# Patient Record
Sex: Female | Born: 1995 | Race: White | Hispanic: No | Marital: Single | Smoking: Former smoker
Health system: Southern US, Community
[De-identification: ages and names within clinical notes are randomized; demographics above are authoritative.]

## PROBLEM LIST (undated history)

## (undated) DIAGNOSIS — F419 Anxiety disorder, unspecified: Secondary | ICD-10-CM

## (undated) DIAGNOSIS — T7840XA Allergy, unspecified, initial encounter: Secondary | ICD-10-CM

## (undated) DIAGNOSIS — J45909 Unspecified asthma, uncomplicated: Secondary | ICD-10-CM

## (undated) HISTORY — PX: TONSILLECTOMY: SUR1361

## (undated) HISTORY — PX: KNEE RECONSTRUCTION: SHX5883

## (undated) HISTORY — DX: Allergy, unspecified, initial encounter: T78.40XA

---
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
## 2014-10-01 ENCOUNTER — Emergency Department (INDEPENDENT_AMBULATORY_CARE_PROVIDER_SITE_OTHER): Payer: Self-pay

## 2014-10-01 ENCOUNTER — Emergency Department
Admission: EM | Admit: 2014-10-01 | Discharge: 2014-10-01 | Disposition: A | Discharge status: Home / Self Care | Payer: 59 | Source: Home / Self Care | Attending: Emergency Medicine | Admitting: Emergency Medicine

## 2014-10-01 ENCOUNTER — Encounter: Payer: Self-pay | Admitting: Emergency Medicine

## 2014-10-01 DIAGNOSIS — S29019A Strain of muscle and tendon of unspecified wall of thorax, initial encounter: Secondary | ICD-10-CM | POA: Diagnosis not present

## 2014-10-01 DIAGNOSIS — S2341XA Sprain of ribs, initial encounter: Secondary | ICD-10-CM | POA: Diagnosis not present

## 2014-10-01 DIAGNOSIS — R0789 Other chest pain: Secondary | ICD-10-CM

## 2014-10-01 DIAGNOSIS — R05 Cough: Secondary | ICD-10-CM

## 2014-10-01 DIAGNOSIS — J4521 Mild intermittent asthma with (acute) exacerbation: Secondary | ICD-10-CM | POA: Diagnosis not present

## 2014-10-01 DIAGNOSIS — R109 Unspecified abdominal pain: Secondary | ICD-10-CM | POA: Diagnosis not present

## 2014-10-01 DIAGNOSIS — R0989 Other specified symptoms and signs involving the circulatory and respiratory systems: Secondary | ICD-10-CM

## 2014-10-01 LAB — POCT MONO SCREEN (KUC): Mono, POC: NEGATIVE

## 2014-10-01 LAB — POCT CBC W AUTO DIFF (K'VILLE URGENT CARE)

## 2014-10-01 MED ORDER — HYDROCODONE-ACETAMINOPHEN 5-325 MG PO TABS: ORAL_TABLET | ORAL | Status: DC

## 2014-10-01 MED ORDER — MELOXICAM 7.5 MG PO TABS: 7.5000 mg | ORAL_TABLET | Freq: Two times a day (BID) | ORAL | Status: DC

## 2014-10-01 MED ORDER — PREDNISONE (PAK) 10 MG PO TABS: ORAL_TABLET | ORAL | Status: DC

## 2014-10-01 NOTE — ED Notes (Signed)
Rt flank pain, patient states she had flu-like sxs last week with cough and since then has had severe rt flank pain, sore to touch x 1 week

## 2014-10-01 NOTE — Discharge Instructions (Signed)
Today, chest x-ray and x-ray right ribs show no fracture. Your complete blood count is normal. Mono test negative.   Asthma Asthma is a condition of the lungs in which the airways tighten and narrow. Asthma can make it hard to breathe. Asthma cannot be cured, but medicine and lifestyle changes can help control it. Asthma may be started (triggered) by:  Animal skin flakes (dander).  Dust.  Cockroaches.  Pollen.  Mold.  Smoke.  Cleaning products.  Hair sprays or aerosol sprays.  Paint fumes or strong smells.  Cold air, weather changes, and winds.  Crying or laughing hard.  Stress.  Certain medicines or drugs.  Foods, such as dried fruit, potato chips, and sparkling grape juice.  Infections or conditions (colds, flu).  Exercise.  Certain medical conditions or diseases.  Exercise or tiring activities. HOME CARE   Take medicine as told by your doctor.  Use a peak flow meter as told by your doctor. A peak flow meter is a tool that measures how well the lungs are working.  Record and keep track of the peak flow meter's readings.  Understand and use the asthma action plan. An asthma action plan is a written plan for taking care of your asthma and treating your attacks.  To help prevent asthma attacks:  Do not smoke. Stay away from secondhand smoke.  Change your heating and air conditioning filter often.  Limit your use of fireplaces and wood stoves.  Get rid of pests (such as roaches and mice) and their droppings.  Throw away plants if you see mold on them.  Clean your floors. Dust regularly. Use cleaning products that do not smell.  Have someone vacuum when you are not home. Use a vacuum cleaner with a HEPA filter if possible.  Replace carpet with wood, tile, or vinyl flooring. Carpet can trap animal skin flakes and dust.  Use allergy-proof pillows, mattress covers, and box spring covers.  Wash bed sheets and blankets every week in hot water and dry  them in a dryer.  Use blankets that are made of polyester or cotton.  Clean bathrooms and kitchens with bleach. If possible, have someone repaint the walls in these rooms with mold-resistant paint. Keep out of the rooms that are being cleaned and painted.  Wash hands often. GET HELP IF:  You have make a whistling sound when breaking (wheeze), have shortness of breath, or have a cough even if taking medicine to prevent attacks.  The colored mucus you cough up (sputum) is thicker than usual.  The colored mucus you cough up changes from clear or white to yellow, green, gray, or bloody.  You have problems from the medicine you are taking such as:  A rash.  Itching.  Swelling.  Trouble breathing.  You need reliever medicines more than 2-3 times a week.  Your peak flow measurement is still at 50-79% of your personal best after following the action plan for 1 hour.  You have a fever. GET HELP RIGHT AWAY IF:   You seem to be worse and are not responding to medicine during an asthma attack.  You are short of breath even at rest.  You get short of breath when doing very little activity.  You have trouble eating, drinking, or talking.  You have chest pain.  You have a fast heartbeat.  Your lips or fingernails start to turn blue.  You are light-headed, dizzy, or faint.  Your peak flow is less than 50% of your personal best.  MAKE SURE YOU:   Understand these instructions.  Will watch your condition.  Will get help right away if you are not doing well or get worse.  A rib contusion (bruise) can occur by a blow to the chest or by a fall against a hard object. Usually these will be much better in a couple weeks. If X-rays were taken today and there are no broken bones (fractures), the diagnosis of bruising is made. However, broken ribs may not show up for several days, or may be discovered later on a routine X-ray when signs of healing show up. If this happens to you, it does  not mean that something was missed on the X-ray, but simply that it did not show up on the first X-rays. Earlier diagnosis will not usually change the treatment. HOME CARE INSTRUCTIONS   Avoid strenuous activity. Be careful during activities and avoid bumping the injured ribs. Activities that pull on the injured ribs and cause pain should be avoided, if possible.  For the first day or two, an ice pack used every 20 minutes while awake may be helpful. Put ice in a plastic bag and put a towel between the bag and the skin.  Eat a normal, well-balanced diet. Drink plenty of fluids to avoid constipation.  Take deep breaths several times a day to keep lungs free of infection. Try to cough several times a day. Splint the injured area with a pillow while coughing to ease pain. Coughing can help prevent pneumonia.  Wear a rib belt or binder only if told to do so by your caregiver. If you are wearing a rib belt or binder, you must do the breathing exercises as directed by your caregiver. If not used properly, rib belts or binders restrict breathing which can lead to pneumonia.  Only take over-the-counter or prescription medicines for pain, discomfort, or fever as directed by your caregiver. SEEK MEDICAL CARE IF:   You have an oral temperature above 102 F (38.9 C)..  You develop a cough, with thick or bloody sputum. SEEK IMMEDIATE MEDICAL CARE IF:   You have difficulty breathing.  You feel sick to your stomach (nausea), have vomiting or belly (abdominal) pain.  You have worsening pain, not controlled with medications, or there is a change in the location of the pain.  You develop sweating or radiation of the pain into the arms, jaw or shoulders, or become light headed or faint.  You have an oral temperature above 102 F (38.9 C), not controlled by medicine.   MAKE SURE YOU:   Understand these instructions.  Will watch your condition.  Will get help right away if you are not doing well  or get worse. Document Released: 04/11/2001 Document Revised: 11/11/2012 Document Reviewed: 03/04/2008 [facility] Patient Information 2015 [place], [place]. This information is not intended to replace advice given to you by your health care provider. Make sure you discuss any questions you have with your health care provider.   Rib Contusion/Strain A rib contusion (bruise) can occur by a blow to the chest or by a fall against a hard object. Usually these will be much better in a couple weeks. If X-rays were taken today and there are no broken bones (fractures), the diagnosis of bruising is made. However, broken ribs may not show up for several days, or may be discovered later on a routine X-ray when signs of healing show up. If this happens to you, it does not mean that something was missed on the  X-ray, but simply that it did not show up on the first X-rays. Earlier diagnosis will not usually change the treatment. HOME CARE INSTRUCTIONS   Avoid strenuous activity. Be careful during activities and avoid bumping the injured ribs. Activities that pull on the injured ribs and cause pain should be avoided, if possible.  For the first day or two, an ice pack used every 20 minutes while awake may be helpful. Put ice in a plastic bag and put a towel between the bag and the skin.  Eat a normal, well-balanced diet. Drink plenty of fluids to avoid constipation.  Take deep breaths several times a day to keep lungs free of infection. Try to cough several times a day. Splint the injured area with a pillow while coughing to ease pain. Coughing can help prevent pneumonia.  Wear a rib belt or binder only if told to do so by your caregiver. If you are wearing a rib belt or binder, you must do the breathing exercises as directed by your caregiver. If not used properly, rib belts or binders restrict breathing which can lead to pneumonia.  Only take over-the-counter or prescription medicines for pain, discomfort, or  fever as directed by your caregiver. SEEK MEDICAL CARE IF:   You or your child has an oral temperature above 102 F (38.9 C).  Your baby is older than 3 months with a rectal temperature of 100.5 F (38.1 C) or higher for more than 1 day.  You develop a cough, with thick or bloody sputum. SEEK IMMEDIATE MEDICAL CARE IF:   You have difficulty breathing.  You feel sick to your stomach (nausea), have vomiting or belly (abdominal) pain.  You have worsening pain, not controlled with medications, or there is a change in the location of the pain.  You develop sweating or radiation of the pain into the arms, jaw or shoulders, or become light headed or faint.  You or your child has an oral temperature above 102 F (38.9 C), not controlled by medicine.  Your or your baby is older than 3 months with a rectal temperature of 102 F (38.9 C) or higher.  Your baby is 69 months old or younger with a rectal temperature of 100.4 F (38 C) or higher. MAKE SURE YOU:   Understand these instructions.  Will watch your condition.  Will get help right away if you are not doing well or get worse. Document Released: 04/11/2001 Document Revised: 11/11/2012 Document Reviewed: 03/04/2008 [facility] Patient Information 2015 [place], [place]. This information is not intended to replace advice given to you by your health care provider. Make sure you discuss any questions you have with your health care provider.

## 2014-10-01 NOTE — ED Provider Notes (Signed)
CSN: [id number]     Arrival date & time 10/01/14  1148 History   First MD Initiated Contact with Patient 10/01/14 1156     Chief Complaint  Patient presents with  . Flank Pain    Patient is a 19 y.o. female presenting with flank pain. The history is provided by the patient.  Flank Pain   Last week had flulike symptoms with fever chills sweats cough and myalgias, was seen at a different urgent care, was told she had a viral syndrome and most of those symptoms have resolved. Fever resolved 3 days ago. Had vomiting last week but that resolved 3 days ago. Still has minimal nausea. Chief complaint today is right lateral chest wall/rib pain, exacerbated by coughing and movement. Pain is sharp, 5 out of 10 at rest, 10 out of 10 with movement and touching right chest. Still has occasional cough. Occasional wheezing. Her albuterol inhaler helps a little bit. Currently denies shortness of breath. Denies any anterior left-sided chest pain or discomfort. Has mild nausea but no vomiting 3 days. Right now, denies nausea today. Tolerating by mouth's. Denies abdominal pain. Denies CVA back pain. Had one or 2 loose stools earlier this week but not currently. No blood or mucus in stool. Last menstrual period started yesterday. She denies chance of pregnancy. No other GYN or GU symptoms. Denies dysuria or urinary frequency. Denies history of kidney stones or kidney disease. She never had mononucleosis. She also admits to recent stressors. She denies frank depression symptoms.  History reviewed. No pertinent past medical history. Past Surgical History  Procedure Laterality Date  . Tonsillectomy    . Knee reconstruction     Family History  Problem Relation Age of Onset  . Heart failure Mother   . Diabetes Father   . Hypertension Father    History  Substance Use Topics  . Smoking status: Current Some Day Smoker    Types: Cigarettes  . Smokeless tobacco: Not on file  . Alcohol Use: No   OB History     No data available     Review of Systems  Constitutional: Positive for fatigue.  Cardiovascular: Negative for palpitations and leg swelling.  Genitourinary: Positive for flank pain (which she means as right chest pain. Denies CVA pain.).  Neurological: Negative for syncope.  Psychiatric/Behavioral: Negative for suicidal ideas and hallucinations.  All other systems reviewed and are negative.   Allergies  Review of patient's allergies indicates no known allergies.  Home Medications   Prior to Admission medications   Not on File   BP 111/75 mmHg  Pulse 90  Temp(Src) 97.6 F (36.4 C) (Oral)  Ht  (1.753 m)  Wt 168 lb (76.204 kg)  BMI 24.80 kg/m2  SpO2 96%  LMP 09/29/2014 Physical Exam  Constitutional: She is oriented to person, place, and time. She appears well-developed and well-nourished. No distress.  Uncomfortable from right sided chest pain, but no acute cardiorespiratory distress. Pulse ox 96% on room air  HENT:  Head: Normocephalic and atraumatic.  Right Ear: External ear normal.  Left Ear: External ear normal.  Nose: Nose normal.  Mouth/Throat: Oropharynx is clear and moist. No oropharyngeal exudate.  Eyes: Conjunctivae are normal. Pupils are equal, round, and reactive to light. Right eye exhibits no discharge. Left eye exhibits no discharge. No scleral icterus.  Neck: Normal range of motion. Neck supple. No JVD present. No tracheal deviation present.  Cardiovascular: Normal rate, regular rhythm, normal heart sounds and intact distal pulses.  Exam  reveals no gallop and no friction rub.   No murmur heard. Pulmonary/Chest: Effort normal. No respiratory distress. She has no decreased breath sounds. She has wheezes (Minimal late expiratory wheezes, but good air movement bilaterally). She has no rhonchi. She has no rales. She exhibits tenderness (Exquisitely tender right anterior lateral and posterior chest/ribs).  Abdominal: Soft. She exhibits no distension and no  mass. There is no hepatosplenomegaly. There is no tenderness. There is no CVA tenderness, no tenderness at McBurney's point and negative Murphy's sign.  Lymphadenopathy:    She has cervical adenopathy (Mild anterior cervical nodes. shoddy, mobile, mildly tender).  Neurological: She is alert and oriented to person, place, and time. No cranial nerve deficit.  Skin: Skin is warm and dry. No rash noted.  Psychiatric: She has a normal mood and affect. Her behavior is normal.    ED Course  Procedures (including critical care time) Labs Review Labs Reviewed  POCT CBC W AUTO DIFF ([facility])  POCT MONO SCREEN ([facility])   CBC within normal limits. WBC 7.9, hemoglobin 14.7 Mono spot negative.  Imaging Review Dg Ribs Unilateral W/chest Right  10/01/2014   CLINICAL DATA:  19 year old female with flu-like symptoms, cough, congestion and fever for 2 weeks. Right-sided chest pain.  EXAM: RIGHT RIBS AND CHEST - 3+ VIEW  COMPARISON:  No priors.  FINDINGS: Lung volumes are normal. No consolidative airspace disease. No pleural effusions. No pneumothorax. No pulmonary nodule or mass noted. Pulmonary vasculature and the cardiomediastinal silhouette are within normal limits.  Dedicated views of the lower right ribs with a radiopaque marker in place demonstrate no acute displaced right-sided rib fractures.  IMPRESSION: 1. No acute displaced right-sided rib fractures. 2. No radiographic evidence of acute cardiopulmonary disease.   Electronically Signed   By: Trudie Reed M.D.   On: 10/01/2014 13:16     MDM   1. Sprain and strain of ribs, initial encounter   2. Asthma with acute exacerbation, mild intermittent    Reviewed that chest x-ray and right rib x-rays negative. No rib fracture seen. No evidence of acute cardiopulmonary disease. Reviewed CBC normal and Monospot negative. Treatment options discussed, as well as risks, benefits, alternatives. I offered a rib belt to help with the pain, but she  declined that. With nurse assistance, with her permission, we wrapped her ribs with a large Ace bandage and that helped relieve some pain. Patient voiced understanding and agreement with the following plans: Apply Heat. Other symptomatic care modalities. RX's: HYDROcodone-acetaminophen (NORCO/VICODIN) 5-325 MG per tablet Take 1 or 2 at bedtime if needed for severe pain or cough. May cause drowsiness. 8 tablet     meloxicam (MOBIC) 7.5 MG tablet Take 1 tablet (7.5 mg total) by mouth 2 (two) times daily with a meal. For pain. 20 tablet    predniSONE (STERAPRED UNI-PAK) 10 MG tablet Take as directed for 6 days.--Take 6 on day one, 5 on day 2, 4 on day 3, then 3 tablets on day 4, then 2 tablets on day 5, then 1 tablet on day 6.    See detailed Instructions in AVS, which were given to patient. Verbal instructions also given. Risks, benefits, and alternatives of treatment options discussed. Questions invited and answered. Patient voiced understanding and agreement with plans. Note written to excuse from work today and tomorrow. Follow-up with your primary care doctor in 5-7 days if not improving, or sooner if symptoms become worse. Precautions discussed. Red flags discussed. Questions invited and answered. Patient voiced understanding  and agreement.    Lajean Manesavid Massey, MD 10/01/14 1355

## 2015-04-07 ENCOUNTER — Encounter: Payer: Self-pay | Admitting: *Deleted

## 2015-04-07 ENCOUNTER — Emergency Department
Admission: EM | Admit: 2015-04-07 | Discharge: 2015-04-07 | Disposition: A | Discharge status: Home / Self Care | Payer: 59 | Source: Home / Self Care | Attending: Family Medicine | Admitting: Family Medicine

## 2015-04-07 DIAGNOSIS — R300 Dysuria: Secondary | ICD-10-CM

## 2015-04-07 DIAGNOSIS — J0100 Acute maxillary sinusitis, unspecified: Secondary | ICD-10-CM | POA: Diagnosis not present

## 2015-04-07 DIAGNOSIS — H109 Unspecified conjunctivitis: Secondary | ICD-10-CM

## 2015-04-07 HISTORY — DX: Unspecified asthma, uncomplicated: J45.909

## 2015-04-07 LAB — POCT URINALYSIS DIP (MANUAL ENTRY)
Bilirubin, UA: NEGATIVE
GLUCOSE UA: NEGATIVE
Ketones, POC UA: NEGATIVE
Leukocytes, UA: NEGATIVE
Nitrite, UA: NEGATIVE
Protein Ur, POC: NEGATIVE
RBC UA: NEGATIVE
Urobilinogen, UA: 0.2 (ref 0–1)
pH, UA: 6 (ref 5–8)

## 2015-04-07 LAB — POCT WET + KOH PREP
Trich by wet prep: ABSENT
YEAST BY KOH: ABSENT
YEAST BY WET PREP: ABSENT

## 2015-04-07 MED ORDER — CEFDINIR 300 MG PO CAPS: 300.0000 mg | ORAL_CAPSULE | Freq: Two times a day (BID) | ORAL | Status: DC

## 2015-04-07 MED ORDER — SULFACETAMIDE SODIUM 10 % OP SOLN: 1.0000 [drp] | OPHTHALMIC | Status: DC

## 2015-04-07 NOTE — ED Notes (Signed)
Pt c/o LT eye redness and yellow d/c x today. She also c/o cough and sore throat x 1 wk. Denies fever. She also c/o dysuria and vaginal irritation x 1 wk.

## 2015-04-07 NOTE — ED Provider Notes (Signed)
CSN: [id number]     Arrival date & time 04/07/15  1615 History   First MD Initiated Contact with Patient 04/07/15 1715     Chief Complaint  Patient presents with  . Eye Problem  . Dysuria      HPI Comments: Patient developed vaginal itching without discharge 1.5 weeks ago.  Her symptoms improved with OTC Monistat.  About a week ago she developed mild dysuria without frequency.  No pelvic or abdominal pain.  Patient's last menstrual period was 03/25/2015.  One week ago she also developed a mild sore throat and nasal congestion without cough.  During the past several days she has felt fatigued and had chills.  Yesterday morning she awoke with left eye redness and irritation.  She complains of left facial pressure and nasal congestion.  The history is provided by the patient.    Past Medical History  Diagnosis Date  . Asthma    Past Surgical History  Procedure Laterality Date  . Tonsillectomy    . Knee reconstruction     Family History  Problem Relation Age of Onset  . Heart failure Mother   . Diabetes Father   . Hypertension Father    Social History  Substance Use Topics  . Smoking status: Former Smoker    Types: Cigarettes  . Smokeless tobacco: None  . Alcohol Use: No   OB History    No data available     Review of Systems + sore throat, resolved No cough No pleuritic pain No wheezing + nasal congestion + post-nasal drainage + sinus pain/pressure + left eye redness and drainage No earache No hemoptysis No SOB No fever, + chills No nausea No vomiting No abdominal pain No diarrhea + urinary symptoms No skin rash + fatigue No myalgias + headache Used OTC meds without relief  Allergies  Review of patient's allergies indicates no known allergies.  Home Medications   Prior to Admission medications   Medication Sig Start Date End Date Taking? Authorizing Provider  albuterol (PROVENTIL HFA;VENTOLIN HFA) 108 (90 BASE) MCG/ACT inhaler Inhale into the lungs  every 6 (six) hours as needed for wheezing or shortness of breath.   Yes Historical Provider, MD  cefdinir (OMNICEF) 300 MG capsule Take 1 capsule (300 mg total) by mouth 2 (two) times daily. 04/07/15   Lattie Haw, MD  sulfacetamide (BLEPH-10) 10 % ophthalmic solution Place 1-2 drops into the left eye every 3 (three) hours while awake. 04/07/15   Lattie Haw, MD   Meds Ordered and Administered this Visit  Medications - No data to display  BP 105/72 mmHg  Pulse 87  Temp(Src) 99.1 F (37.3 C) (Oral)  Resp 16  Ht 5\' 8"  (1.727 m)  Wt 165 lb (74.844 kg)  BMI 25.09 kg/m2  SpO2 99%  LMP 03/25/2015 No data found.   Physical Exam Nursing notes and Vital Signs reviewed. Appearance:  Patient appears stated age, and in no acute distress Eyes:  Pupils are equal, round, and reactive to light and accomodation.  Extraocular movement is intact. Left conjunctivae minimally injected.  No lid swelling or tenderness.  No photophobia. Ears:  Canals normal.  Tympanic membranes normal.  Nose:  Congested turbinates.  Left maxillary sinus tenderness is present.  Pharynx:  Normal Neck:  Supple.  Tender enlarged left posterior nodes palpated Lungs:  Clear to auscultation.  Breath sounds are equal.  Moving air well. Heart:  Regular rate and rhythm without murmurs, rubs, or gallops.  Abdomen:  Nontender  without masses or hepatosplenomegaly.  Bowel sounds are present.  No CVA or flank tenderness.  Extremities:  No edema.  No calf tenderness Skin:  No rash present.   ED Course  Procedures  None    Labs Reviewed  URINE CULTURE  POCT URINALYSIS DIP (MANUAL ENTRY):  Negative  POCT WET + KOH PREP:  KOH/Wet Prep negative      MDM   1. Acute maxillary sinusitis, recurrence not specified   2. Conjunctivitis, left eye;  Suspect initiated by viral URI   3. Dysuria; doubt UTI   Urine culture pending. Begin Omnicef  BID.  Begin sulfacetamide ophthalmic suspension to left eye. If cough develops,  take plain guaifenesin (  extended release tabs such as Mucinex) twice daily, with plenty of water, for cough and congestion.  May add Pseudoephedrine ( , one or two every 4 to 6 hours) for sinus congestion.  Get adequate rest.   May use Afrin nasal spray (or generic oxymetazoline) twice daily for about 5 days and then discontinue.  Also recommend using saline nasal spray several times daily and saline nasal irrigation (AYR is a common brand).  Use Flonase nasal spray each morning after using Afrin nasal spray and saline nasal irrigation. Try warm salt water gargles for sore throat.  Stop all antihistamines for now, and other non-prescription cough/cold preparations. May take Delsym Cough Suppressant at bedtime for nighttime cough.    Follow-up with family doctor if not improving about10 days.    Lattie Haw, MD 04/08/15 [phone]

## 2015-04-07 NOTE — Discharge Instructions (Signed)
If cough develops, take plain guaifenesin (  extended release tabs such as Mucinex) twice daily, with plenty of water, for cough and congestion.  May add Pseudoephedrine ( , one or two every 4 to 6 hours) for sinus congestion.  Get adequate rest.   May use Afrin nasal spray (or generic oxymetazoline) twice daily for about 5 days and then discontinue.  Also recommend using saline nasal spray several times daily and saline nasal irrigation (AYR is a common brand).  Use Flonase nasal spray each morning after using Afrin nasal spray and saline nasal irrigation. Try warm salt water gargles for sore throat.  Stop all antihistamines for now, and other non-prescription cough/cold preparations. May take Delsym Cough Suppressant at bedtime for nighttime cough.    Follow-up with family doctor if not improving about10 days.

## 2015-04-08 LAB — URINE CULTURE
Colony Count: NO GROWTH
Organism ID, Bacteria: NO GROWTH

## 2015-04-09 ENCOUNTER — Telehealth: Payer: Self-pay | Admitting: *Deleted

## 2015-08-26 ENCOUNTER — Telehealth: Payer: Self-pay | Admitting: Medical

## 2015-08-26 ENCOUNTER — Encounter: Payer: Self-pay | Admitting: Medical

## 2015-08-26 ENCOUNTER — Ambulatory Visit (INDEPENDENT_AMBULATORY_CARE_PROVIDER_SITE_OTHER): Payer: 59 | Admitting: Medical

## 2015-08-26 VITALS — BP 98/70 | HR 108 | Temp 98.1°F | Ht 68.5 in | Wt 158.2 lb

## 2015-08-26 DIAGNOSIS — R82998 Other abnormal findings in urine: Secondary | ICD-10-CM

## 2015-08-26 DIAGNOSIS — R102 Pelvic and perineal pain: Secondary | ICD-10-CM

## 2015-08-26 DIAGNOSIS — N390 Urinary tract infection, site not specified: Secondary | ICD-10-CM

## 2015-08-26 DIAGNOSIS — R300 Dysuria: Secondary | ICD-10-CM | POA: Diagnosis not present

## 2015-08-26 LAB — POC URINALSYSI DIPSTICK (AUTOMATED)
Bilirubin, UA: NEGATIVE
Glucose, UA: NEGATIVE
PH UA: 5
Urobilinogen, UA: 1

## 2015-08-26 MED ORDER — CEFTRIAXONE SODIUM 1 G IJ SOLR
1.0000 g | Freq: Once | INTRAMUSCULAR | Status: AC
  Administered 2015-08-26: 1 g via INTRAMUSCULAR

## 2015-08-26 MED ORDER — NITROFURANTOIN MONOHYD MACRO 100 MG PO CAPS: 100.0000 mg | ORAL_CAPSULE | Freq: Two times a day (BID) | ORAL | Status: DC

## 2015-08-26 NOTE — Progress Notes (Signed)
Pre visit review using our clinic review tool, if applicable. No additional management support is needed unless otherwise documented below in the visit note. 

## 2015-08-26 NOTE — Telephone Encounter (Signed)
Will you call pt tomorrow and advise her after reviewing her urine study further did decide to send in macrobid. But still come in and give urine ancillary study sample.

## 2015-08-26 NOTE — Progress Notes (Signed)
Subjective:    Patient ID: Nancy Johnston, female    DOB: 1995/08/28, 20 y.o.   MRN: [id number]  HPI  I have reviewed pt PMH, PSH, FH, Social History and Surgical History  Pt in today some vaginal area pain for 6 days. Pt stats sharp pain. Early on some pain on urination. Pt on her menstrual cycle. Started 2 days ago. Pt removed her tampon yesterday. Pain seemed to get better after tampon use. Pt states she always uses condom during sex. So she expresses doubt that could be std.  Pt tried to go to urgent care. They would not see her.  Pt denies fevers chills or sweats. No abnormal discharge reported.  Seasonal allergies- use otc zyrtec controls.  Asthma- uses albuterol 2 times a year for about 2-3 days only. Season changes or exercise related wheezing.   Review of Systems  HENT: Negative for congestion and drooling.   Respiratory: Negative for cough, chest tightness, shortness of breath and wheezing.   Cardiovascular: Negative for chest pain and palpitations.  Gastrointestinal: Negative for abdominal pain.  Genitourinary: Positive for dysuria and pelvic pain. Negative for urgency, frequency, flank pain, decreased urine volume, vaginal bleeding, enuresis, difficulty urinating, vaginal pain and menstrual problem.       Describes some discomfort in her vagina.  Musculoskeletal: Negative for back pain.  Hematological: Negative for adenopathy. Does not bruise/bleed easily.  Psychiatric/Behavioral: Negative for behavioral problems and confusion.    Past Medical History  Diagnosis Date  . Asthma     Social History   Social History  . Marital Status: Single    Spouse Name: N/A  . Number of Children: N/A  . Years of Education: N/A   Occupational History  . Not on file.   Social History Main Topics  . Smoking status: Former Smoker    Types: Cigarettes  . Smokeless tobacco: Not on file  . Alcohol Use: No  . Drug Use: No  . Sexual Activity: Not on file   Other Topics  Concern  . Not on file   Social History Narrative    Past Surgical History  Procedure Laterality Date  . Tonsillectomy    . Knee reconstruction      Family History  Problem Relation Age of Onset  . Heart failure Mother   . Diabetes Father   . Hypertension Father     Allergies  Allergen Reactions  . Other     Pt has allergic reaction to dog hair.   Marland Kitchen Shellfish Allergy     Current Outpatient Prescriptions on File Prior to Visit  Medication Sig Dispense Refill  . albuterol (PROVENTIL HFA;VENTOLIN HFA) 108 (90 BASE) MCG/ACT inhaler Inhale into the lungs every 6 (six) hours as needed for wheezing or shortness of breath.     No current facility-administered medications on file prior to visit.    BP 98/70 mmHg  Pulse 108  Temp(Src) 98.1 F (36.7 C) (Oral)  Ht 5' 8.5" (1.74 m)  Wt 158 lb 3.2 oz (71.759 kg)  BMI 23.70 kg/m2  SpO2 98%  LMP 08/26/2015       Objective:   Physical Exam  General   Mental Status- Alert.  Orientation-Oriented x3. Build and Nutrition Well Nourished and Well Developed.   Chest and Lung Exam  Breath sounds-Normal. Adventitious  Sounds:No adventitious    Cardiovascular Auscultation: Heart Sounds- Normal sinus rhythm without murmur or gallop, S1 WNL and S2 WNL.  Abdomen Inspection:- Inspection Normal. Inspection of abdomen reveals-  No Hernias. Palpation/Percussion: Palpation and Percussion of the abdomen reveal- faint suprapubic  Tender and No Palpable masses. Liver: Other Characteristics- No Hepatmegaly Spleen:Other Characteristics- No Splenomegaly. Auscultation: Auscultation of the abdomen reveals-Bowel sounds normal and No Abdominal bruits.    Neurologic CN III- XII grossly intact.  Back- no cva tenderness.           Assessment & Plan:  [phone].  For possible uti will give rocephin today pending urine culture results. Will send in macrobid antibitoic pending results of urine culture  Please return tomorrow  and give other urine sample so we can get ancillary studies. These are very important in completing your work up.  If pain worsens then will need pelvic exam.  Follow up 5 days or as needed.  Note pt was unable to give large amount of urine and decided to send for urine culture. Did not have enough to do urine ancillary studies.

## 2015-08-26 NOTE — Patient Instructions (Addendum)
For possible uti will give rocephin today pending urine culture results.Will send in macrobid antibitoic pending results of urine culture  Please return tomorrow and give other urine sample so we can get ancillary studies. These are very important in completing your work up.  If pain worsens then will need pelvic exam.  Follow up 5 days or as needed.  Note pt was unable to give large amount of urine and decided to send for urine culture. Did not have enough to do urine ancillary studies.

## 2015-08-27 NOTE — Telephone Encounter (Signed)
Patient informed and will have her urine checked at [facility] though since is free for her there.  She will have the results faxed to [facility].  PA.  She will pickup prescription and start taking.

## 2015-08-31 ENCOUNTER — Telehealth: Payer: Self-pay | Admitting: Medical

## 2015-08-31 NOTE — Telephone Encounter (Signed)
I had note that urine culture was not done? I thought this was collected. Pt uses [facility]. Will you ask lab to call over and see if she had that done. Also she can scant amount of urine and I asked her to come back the next day to give sample so we could do urine ancillary studies. Did she every come back. Also will you call pt and see how she is dong.

## 2015-08-31 NOTE — Telephone Encounter (Signed)
Pt was advised at her appointment time to come back for a repeat urine. We are still waiting on urine results from [facility].

## 2015-09-04 ENCOUNTER — Telehealth: Payer: Self-pay | Admitting: Medical

## 2015-09-04 DIAGNOSIS — R102 Pelvic and perineal pain: Secondary | ICD-10-CM

## 2015-09-04 NOTE — Telephone Encounter (Signed)
Pt urine culture did show some group b strep. I gave rocephin and that should be effective against strep. Call and see if she has nay residual symptoms. If so then send in amoxicilin 500 mg #30 1 tab po tid for 7 days. Did she every turn the ancillary studies. How is she now?

## 2015-09-06 NOTE — Telephone Encounter (Signed)
Spoke with pt and she states that she is feeling better and she will come by this week and drop off a urine sample to send off for ancillary studies. Ancillary studies printed off.

## 2015-11-29 ENCOUNTER — Encounter: Payer: Self-pay | Admitting: Obstetrics & Gynecology

## 2015-11-29 ENCOUNTER — Ambulatory Visit (INDEPENDENT_AMBULATORY_CARE_PROVIDER_SITE_OTHER): Payer: 59 | Admitting: Obstetrics & Gynecology

## 2015-11-29 VITALS — BP 106/64 | HR 79 | Resp 16 | Ht 68.0 in | Wt 159.0 lb

## 2015-11-29 DIAGNOSIS — Z113 Encounter for screening for infections with a predominantly sexual mode of transmission: Secondary | ICD-10-CM

## 2015-11-29 DIAGNOSIS — Z124 Encounter for screening for malignant neoplasm of cervix: Secondary | ICD-10-CM

## 2015-11-29 DIAGNOSIS — Z01419 Encounter for gynecological examination (general) (routine) without abnormal findings: Secondary | ICD-10-CM

## 2015-11-29 DIAGNOSIS — N926 Irregular menstruation, unspecified: Secondary | ICD-10-CM | POA: Diagnosis not present

## 2015-11-29 MED ORDER — NORGESTREL-ETHINYL ESTRADIOL 0.3-30 MG-MCG PO TABS: 1.0000 | ORAL_TABLET | Freq: Every day | ORAL | Status: DC

## 2015-11-29 NOTE — Progress Notes (Signed)
Subjective:    Nancy Johnston is a 20 y.o. SWG0  female who presents for an annual exam. The patient has no complaints today. She has about 2 years of her hands skaking.  For the last 2 months she has been having irregular bleeding. Prior to this she would skip anywhere from 3-6 months. During college her periods were monthly. She tried the OCPs in the past for about 6 months but had depression related to it. She uses condoms faithfully. The patient is sexually active. GYN screening history: no prior history of gyn screening tests. The patient wears seatbelts: yes. The patient participates in regular exercise: yes. Has the patient ever been transfused or tattooed?: yes. The patient reports that there is not domestic violence in her life.   Menstrual History: OB History    Gravida Para Term Preterm AB TAB SAB Ectopic Multiple Living   0 0 0 0 0 0 0 0 0 0       Menarche age: [age]  Patient's last menstrual period was 09/29/2015.    The following portions of the patient's history were reviewed and updated as appropriate: allergies, current medications, past family history, past medical history, past social history, past surgical history and problem list.  Review of Systems Pertinent items noted in HPI and remainder of comprehensive ROS otherwise negative. Starting back at [employer], studying [employer]. Monogamous for the last year. No STI testing. Declines Gardasil.   Objective:    BP 106/64 mmHg  Pulse 79  Resp 16  Ht 5\' 8"  (1.727 m)  Wt 159 lb (72.122 kg)  BMI 24.18 kg/m2  LMP 09/29/2015  General Appearance:    Alert, cooperative, no distress, appears stated age  Head:    Normocephalic, without obvious abnormality, atraumatic  Eyes:    PERRL, conjunctiva/corneas clear, EOM's intact, fundi    benign, both eyes  Ears:    Normal TM's and external ear canals, both ears  Nose:   Nares normal, septum midline, mucosa normal, no drainage    or sinus tenderness  Throat:   Lips, mucosa,  and tongue normal; teeth and gums normal  Neck:   Supple, symmetrical, trachea midline, no adenopathy;    thyroid:  no enlargement/tenderness/nodules; no carotid   bruit or JVD  Back:     Symmetric, no curvature, ROM normal, no CVA tenderness  Lungs:     Clear to auscultation bilaterally, respirations unlabored  Chest Wall:    No tenderness or deformity   Heart:    Regular rate and rhythm, S1 and S2 normal, no murmur, rub   or gallop  Breast Exam:    No tenderness, masses, or nipple abnormality  Abdomen:     Soft, non-tender, bowel sounds active all four quadrants,    no masses, no organomegaly  Genitalia:    Normal female without lesion, discharge or tenderness, NSSA, NT, mobile, normal adnexal exam     Extremities:   Extremities normal, atraumatic, no cyanosis or edema  Pulses:   2+ and symmetric all extremities  Skin:   Skin color, texture, turgor normal, no rashes or lesions  Lymph nodes:   Cervical, supraclavicular, and axillary nodes normal  Neurologic:   CNII-XII intact, normal strength, sensation and reflexes    throughout  .    Assessment:    Healthy female exam.   Irregular periods   Plan:     Thin prep Pap smear. STI testing   CBC TSH Trial of Lo ovral. If she starts feeling depressed,  change to a different formulation

## 2015-11-30 ENCOUNTER — Telehealth: Payer: Self-pay | Admitting: *Deleted

## 2015-11-30 LAB — HEPATITIS B SURFACE ANTIGEN: HEP B S AG: NEGATIVE

## 2015-11-30 LAB — HIV ANTIBODY (ROUTINE TESTING W REFLEX): HIV 1&2 Ab, 4th Generation: NONREACTIVE

## 2015-11-30 LAB — HEPATITIS C ANTIBODY: HCV Ab: NEGATIVE

## 2015-11-30 LAB — RPR

## 2015-11-30 NOTE — Telephone Encounter (Signed)
Pt notified of normal bloodwork

## 2015-12-01 LAB — CYTOLOGY - PAP

## 2017-01-17 ENCOUNTER — Ambulatory Visit: Payer: 59 | Admitting: Clinical

## 2017-01-25 ENCOUNTER — Ambulatory Visit (INDEPENDENT_AMBULATORY_CARE_PROVIDER_SITE_OTHER): Payer: 59 | Admitting: Clinical

## 2017-01-25 DIAGNOSIS — F419 Anxiety disorder, unspecified: Secondary | ICD-10-CM

## 2017-02-02 ENCOUNTER — Ambulatory Visit (INDEPENDENT_AMBULATORY_CARE_PROVIDER_SITE_OTHER): Payer: 59 | Admitting: Clinical

## 2017-02-02 DIAGNOSIS — F419 Anxiety disorder, unspecified: Secondary | ICD-10-CM | POA: Diagnosis not present

## 2017-02-12 ENCOUNTER — Ambulatory Visit (INDEPENDENT_AMBULATORY_CARE_PROVIDER_SITE_OTHER): Payer: 59 | Admitting: Clinical

## 2017-02-12 DIAGNOSIS — F419 Anxiety disorder, unspecified: Secondary | ICD-10-CM | POA: Diagnosis not present

## 2017-02-13 ENCOUNTER — Encounter: Payer: Self-pay | Admitting: Obstetrics & Gynecology

## 2017-02-13 ENCOUNTER — Ambulatory Visit (INDEPENDENT_AMBULATORY_CARE_PROVIDER_SITE_OTHER): Payer: 59 | Admitting: Obstetrics & Gynecology

## 2017-02-13 VITALS — BP 118/75 | HR 96 | Resp 16 | Ht 67.0 in | Wt 146.0 lb

## 2017-02-13 DIAGNOSIS — N898 Other specified noninflammatory disorders of vagina: Secondary | ICD-10-CM | POA: Diagnosis not present

## 2017-02-13 MED ORDER — METRONIDAZOLE 500 MG PO TABS
500.0000 mg | ORAL_TABLET | Freq: Two times a day (BID) | ORAL | 0 refills | Status: AC
Start: 2017-02-13 — End: ?

## 2017-02-13 MED ORDER — FLUCONAZOLE 150 MG PO TABS: 150.0000 mg | ORAL_TABLET | Freq: Once | ORAL | 3 refills | Status: AC

## 2017-02-13 NOTE — Addendum Note (Signed)
Addended by: Granville LewisLARK, LORA L on: 02/13/2017 03:16 PM   Modules accepted: Orders

## 2017-02-13 NOTE — Progress Notes (Signed)
   Subjective:    Patient ID: Nancy Johnston, female    DOB: 06/02/1996, 21 y.o.   MRN: [id number]  HPI 21 yo SWG0 here with a 5 day h/o vaginal discharge, odor, and burning. She has been in a monogamous relationship for more than 2 years. She tried a 1 day brand Monistat insert but the symptoms did not improve.   Review of Systems     Objective:   Physical Exam Pleasant [id number] Breathing, conversing, and ambulating normally Normal shaved vulva Vagina with clumpy, frothy white discharge       Assessment & Plan:  Probable yeast and BV Wet prep sent Treat with flagyl and diflucan

## 2017-02-15 LAB — CERVICOVAGINAL ANCILLARY ONLY
Bacterial vaginitis: POSITIVE — AB
Candida vaginitis: NEGATIVE

## 2017-02-19 ENCOUNTER — Telehealth: Payer: Self-pay | Admitting: *Deleted

## 2017-02-19 NOTE — Telephone Encounter (Signed)
LM on pt's voicemail of test results  Pt was already given meds for BV at her appt.

## 2017-02-19 NOTE — Telephone Encounter (Signed)
Pt notified of labs

## 2017-02-27 ENCOUNTER — Ambulatory Visit (INDEPENDENT_AMBULATORY_CARE_PROVIDER_SITE_OTHER): Payer: 59 | Admitting: Clinical

## 2017-02-27 DIAGNOSIS — F419 Anxiety disorder, unspecified: Secondary | ICD-10-CM

## 2017-03-12 ENCOUNTER — Ambulatory Visit (INDEPENDENT_AMBULATORY_CARE_PROVIDER_SITE_OTHER): Payer: 59 | Admitting: Clinical

## 2017-03-12 DIAGNOSIS — F419 Anxiety disorder, unspecified: Secondary | ICD-10-CM

## 2017-03-26 ENCOUNTER — Ambulatory Visit (INDEPENDENT_AMBULATORY_CARE_PROVIDER_SITE_OTHER): Payer: 59 | Admitting: Clinical

## 2017-03-26 DIAGNOSIS — F419 Anxiety disorder, unspecified: Secondary | ICD-10-CM | POA: Diagnosis not present

## 2018-12-09 ENCOUNTER — Emergency Department
Admission: EM | Admit: 2018-12-09 | Discharge: 2018-12-09 | Disposition: A | Discharge status: Home / Self Care | Payer: Managed Care, Other (non HMO) | Source: Home / Self Care

## 2018-12-09 ENCOUNTER — Other Ambulatory Visit: Payer: Self-pay

## 2018-12-09 ENCOUNTER — Emergency Department (INDEPENDENT_AMBULATORY_CARE_PROVIDER_SITE_OTHER): Payer: Managed Care, Other (non HMO)

## 2018-12-09 DIAGNOSIS — R059 Cough, unspecified: Secondary | ICD-10-CM

## 2018-12-09 DIAGNOSIS — R053 Chronic cough: Secondary | ICD-10-CM

## 2018-12-09 DIAGNOSIS — R062 Wheezing: Secondary | ICD-10-CM

## 2018-12-09 DIAGNOSIS — R05 Cough: Secondary | ICD-10-CM | POA: Diagnosis not present

## 2018-12-09 DIAGNOSIS — R0602 Shortness of breath: Secondary | ICD-10-CM

## 2018-12-09 HISTORY — DX: Anxiety disorder, unspecified: F41.9

## 2018-12-09 LAB — POCT INFLUENZA A/B
Influenza A, POC: NEGATIVE
Influenza B, POC: NEGATIVE

## 2018-12-09 MED ORDER — PREDNISONE 50 MG PO TABS: 50.0000 mg | ORAL_TABLET | Freq: Every day | ORAL | 0 refills | Status: AC

## 2018-12-09 MED ORDER — PREDNISONE 50 MG PO TABS
60.0000 mg | ORAL_TABLET | Freq: Once | ORAL | Status: AC
  Administered 2018-12-09: 60 mg via ORAL

## 2018-12-09 MED ORDER — ALBUTEROL SULFATE HFA 108 (90 BASE) MCG/ACT IN AERS: 1.0000 | INHALATION_SPRAY | Freq: Four times a day (QID) | RESPIRATORY_TRACT | 0 refills | Status: AC | PRN

## 2018-12-09 NOTE — ED Provider Notes (Signed)
[facility]    CSN: [id number] Arrival date & time: 12/09/18  1324     History   Chief Complaint Chief Complaint  Patient presents with  . Cough    HPI Charlane Ferrettishley R Chavero is a 23 y.o. female.   HPI  Charlane Ferrettishley R Petrakis is a 23 y.o. female presenting to UC with c/o worsening minimally productive cough with chest tightness and wheeze since around the fist of March.  Cough has become deep and somewhat "gurgles" at times.  She stayed out of work a few weeks ago, started to improve but then last night she had an asthma attack at work. She has been using her albuterol more often than prescribed, which concerns her. Her father encouraged her to come be evaluated to make sure her lungs don't collapse.  Denies fever, chills, n/v/d. No known sick contacts but she does work at [employer]. She does wear PPE including a facemask at work.  She has been taking daily allergy medication but no other new medications. She does not have a PCP. The last albuterol inhaler prescribed to her was from a [facility].   No prior hx of pneumonia but she has had bronchitis in the past and was prescribed oral prednisone at that time, she did well with that but had nausea and vomiting with tessalon pearls she was prescribed at the same time.    Past Medical History:  Diagnosis Date  . Allergy   . Anxiety   . Asthma    seasonl and exercise induced.    Patient Active Problem List   Diagnosis Date Noted  . HEADACHE, TENSION 09/08/2010  . ASTHMA 09/08/2010    Past Surgical History:  Procedure Laterality Date  . KNEE RECONSTRUCTION    . TONSILLECTOMY      OB History    Gravida  0   Para  0   Term  0   Preterm  0   AB  0   Living  0     SAB  0   TAB  0   Ectopic  0   Multiple  0   Live Births               Home Medications    Prior to Admission medications   Medication Sig Start Date End Date Taking? Authorizing Provider  albuterol (VENTOLIN HFA) 108 (90 Base) MCG/ACT inhaler  Inhale 1-2 puffs into the lungs every 6 (six) hours as needed for wheezing or shortness of breath. 12/09/18   Lurene ShadowPhelps, Shelly Spenser O, PA-C  AZO-CRANBERRY PO Take by mouth.    [provider]  cetirizine (ZYRTEC) 10 MG tablet Take 10 mg by mouth daily.    [provider]  ibuprofen (ADVIL,MOTRIN) 200 MG tablet Take 400 mg by mouth.    [provider]  metroNIDAZOLE (FLAGYL) 500 MG tablet Take 1 tablet (500 mg total) by mouth 2 (two) times daily. 02/13/17   Allie Bossierove, Myra C, MD  Multiple Vitamins-Minerals (HAIR/SKIN/NAILS PO) Take by mouth.    [provider]  predniSONE (DELTASONE) 50 MG tablet Take 1 tablet (50 mg total) by mouth daily with breakfast for 5 days. 12/09/18 12/14/18  Lurene ShadowPhelps, Reeya Bound O, PA-C    Family History Family History  Problem Relation Age of Onset  . Heart failure Mother   . Transient ischemic attack Mother   . Diabetes Father   . Hypertension Father     Social History Social History   Tobacco Use  . Smoking status:  Former Smoker    Types: Cigarettes  . Smokeless tobacco: Never Used  Substance Use Topics  . Alcohol use: No    Alcohol/week: 0.0 standard drinks  . Drug use: No     Allergies   Other and Shellfish allergy   Review of Systems Review of Systems  Constitutional: Negative for chills and fever.  HENT: Positive for congestion. Negative for ear pain, sore throat, trouble swallowing and voice change.   Respiratory: Positive for cough, chest tightness, shortness of breath and wheezing.   Cardiovascular: Negative for chest pain and palpitations.  Gastrointestinal: Negative for abdominal pain, diarrhea, nausea and vomiting.  Musculoskeletal: Negative for arthralgias, back pain and myalgias.  Skin: Negative for rash.     Physical Exam Triage Vital Signs ED Triage Vitals  Enc Vitals Group     BP 12/09/18 1356 130/87     Pulse Rate 12/09/18 1356 95     Resp 12/09/18 1356 (!) 22     Temp 12/09/18 1356 97.6 F (36.4 C)      Temp Source 12/09/18 1356 Oral     SpO2 12/09/18 1356 97 %     Weight 12/09/18 1357 148 lb (67.1 kg)     Height 12/09/18 1357  (1.727 m)     Head Circumference --      Peak Flow --      Pain Score 12/09/18 1357 0     Pain Loc --      Pain Edu? --      Excl. in GC? --    No data found.  Updated Vital Signs BP 130/87 (BP Location: Right Arm)   Pulse 95   Temp 97.6 F (36.4 C) (Oral)   Resp (!) 22   Ht  (1.727 m)   Wt 148 lb (67.1 kg)   LMP 12/02/2018   SpO2 97%   BMI 22.50 kg/m   Visual Acuity Right Eye Distance:   Left Eye Distance:   Bilateral Distance:    Right Eye Near:   Left Eye Near:    Bilateral Near:     Physical Exam Vitals signs and nursing note reviewed.  Constitutional:      Appearance: Normal appearance. She is well-developed.  HENT:     Head: Normocephalic and atraumatic.     Right Ear: Tympanic membrane normal.     Left Ear: Tympanic membrane normal.     Nose: Nose normal.     Right Sinus: No maxillary sinus tenderness or frontal sinus tenderness.     Left Sinus: No maxillary sinus tenderness or frontal sinus tenderness.     Mouth/Throat:     Lips: Pink.     Mouth: Mucous membranes are moist.     Pharynx: Oropharynx is clear. Uvula midline.  Neck:     Musculoskeletal: Normal range of motion.  Cardiovascular:     Rate and Rhythm: Normal rate and regular rhythm.  Pulmonary:     Effort: Pulmonary effort is normal. No respiratory distress.     Breath sounds: No stridor. Wheezing and rhonchi present.     Comments: Diffuse inspiratory and expiratory wheeze throughout, worse in Left lower lung field with mild rhonchi. Musculoskeletal: Normal range of motion.  Skin:    General: Skin is warm and dry.  Neurological:     Mental Status: She is alert and oriented to person, place, and time.  Psychiatric:        Behavior: Behavior normal.      UC Treatments /  Results  Labs (all labs ordered are listed, but only abnormal results are  displayed) Labs Reviewed  SARS-COV-2 RNA, QUALITATIVE REAL-TIME RT-PCR  POCT INFLUENZA A/B    EKG None  Radiology Dg Chest 2 View  Result Date: 12/09/2018 CLINICAL DATA:  Cough and wheezing EXAM: CHEST - 2 VIEW COMPARISON:  10/01/2014 FINDINGS: The heart size and mediastinal contours are within normal limits. Both lungs are clear. The visualized skeletal structures are unremarkable. IMPRESSION: No active cardiopulmonary disease. Electronically Signed   By: Marlan Palau M.D.   On: 12/09/2018 14:36    Procedures Procedures (including critical care time)  Medications Ordered in UC Medications  predniSONE (DELTASONE) tablet 60 mg (60 mg Oral Given 12/09/18 1416)    Initial Impression / Assessment and Plan / UC Course  I have reviewed the triage vital signs and the nursing notes.  Pertinent labs & imaging results that were available during my care of the patient were reviewed by me and considered in my medical decision making (see chart for details).     Reviewed imaging with pt Given that pt works in [employer] and Covid-19 has been shown to have community spread in this area, it is reasonable to test pt for Covid-19 given worsening cough and SOB. Rapid flu test- negative Covid-19 test sent to lab [facility] with home care instructions provided to pt. Will tx pt symptomatically at this time. She is stable for discharge home.  Pt given work note for 7 days while testing is pending.  Final Clinical Impressions(s) / UC Diagnoses   Final diagnoses:  Cough  Wheeze  Persistent cough for 3 weeks or longer  Mild shortness of breath     Discharge Instructions      Please self-isolate until your test for Covid-19 comes back, likely by the end of the week.    Wear a face mask if you must be around others.    If symptoms worsening, please call 911 or go to the closest emergency department for further evaluation and treatment.   It is recommended to establish care with a primary  care provider for ongoing healthcare needs including allergy and asthma management.     ED Prescriptions    Medication Sig Dispense Auth. Provider   predniSONE (DELTASONE) 50 MG tablet Take 1 tablet (50 mg total) by mouth daily with breakfast for 5 days. 5 tablet Doroteo Glassman, Olsen Mccutchan O, PA-C   albuterol (VENTOLIN HFA) 108 (90 Base) MCG/ACT inhaler Inhale 1-2 puffs into the lungs every 6 (six) hours as needed for wheezing or shortness of breath. 1 Inhaler Lurene Shadow, PA-C     Controlled Substance Prescriptions [place] Controlled Substance Registry consulted? Not Applicable   Rolla Plate 12/09/18 1555

## 2018-12-09 NOTE — ED Triage Notes (Signed)
Pt has a hx of asthma, beginning around the first of march, she started coughing, and it has not gone away.  She states that it is a deep cough now and somewhat gurgles.  She was out of work a couple of weeks ago the whole week because she "just could not breath".  Yesterday she had an asthma attack at work, and called a teledoc.  She has not been tested for COVID, denies fever or other sx than cough.

## 2018-12-09 NOTE — Discharge Instructions (Signed)
°  Please self-isolate until your test for Covid-19 comes back, likely by the end of the week.    Wear a face mask if you must be around others.    If symptoms worsening, please call 911 or go to the closest emergency department for further evaluation and treatment.   It is recommended to establish care with a primary care provider for ongoing healthcare needs including allergy and asthma management.

## 2018-12-12 LAB — SARS-COV-2 RNA, QUALITATIVE REAL-TIME RT-PCR: SARS CoV 2 RNA, RT PCR: NOT DETECTED

## 2018-12-13 ENCOUNTER — Telehealth: Payer: Self-pay

## 2018-12-13 NOTE — Telephone Encounter (Signed)
Spoke with patient and gave lab results.  She is feeling a little better.  Will follow up as needed.

## 2020-03-15 ENCOUNTER — Encounter: Payer: Managed Care, Other (non HMO) | Admitting: Obstetrics and Gynecology

## 2020-09-03 ENCOUNTER — Telehealth: Payer: Self-pay

## 2020-09-03 ENCOUNTER — Other Ambulatory Visit: Payer: Self-pay

## 2020-09-03 ENCOUNTER — Emergency Department
Admission: EM | Admit: 2020-09-03 | Discharge: 2020-09-03 | Disposition: A | Discharge status: Home / Self Care | Payer: Managed Care, Other (non HMO) | Source: Home / Self Care

## 2020-09-03 DIAGNOSIS — R55 Syncope and collapse: Secondary | ICD-10-CM

## 2020-09-03 DIAGNOSIS — Z8616 Personal history of COVID-19: Secondary | ICD-10-CM

## 2020-09-03 DIAGNOSIS — R1110 Vomiting, unspecified: Secondary | ICD-10-CM

## 2020-09-03 LAB — POCT FASTING CBG KUC MANUAL ENTRY: POCT Glucose (KUC): 99 mg/dL (ref 70–99)

## 2020-09-03 NOTE — Telephone Encounter (Signed)
Left VM for pt. Per Denny Peon, please f/u with ER tonight if HA does not improve. Stressed importance of possible need for stat labs and headscan to r/o blood cot due to covid if HA does not improve.

## 2020-09-03 NOTE — ED Notes (Addendum)
Patient is being discharged from the Urgent Care and sent to the Emergency Department via POV driven by brother. Per Waylan Rocher PAC, patient is in need of higher level of care due to further evaluation of syncopal episode. Patient is aware and verbalizes understanding of plan of care.  Vitals:   09/03/20 1654  BP: 114/80  Resp: 16  Temp: 98.6 F (37 C)  SpO2: 96%

## 2020-09-03 NOTE — Discharge Instructions (Signed)
°  You have declined EMS transport but it is still recommended you have your brother drive you to the emergency department for further evaluation and treatment as no definite cause was found in Urgent Care this evening.  The hospital has extra testing/imaging/resources not available here at this time.

## 2020-09-03 NOTE — ED Provider Notes (Signed)
[facility]    CSN: [id number] Arrival date & time: 09/03/20  1638      History   Chief Complaint Chief Complaint  Patient presents with  . Loss of Consciousness  . Emesis    HPI Nancy Johnston is a 25 y.o. female.   HPI Nancy Johnston is a 25 y.o. female driven to UC by her brother, presenting to UC with c/o syncopal episode at home about 2 hours PTA, followed by several episodes of "projectile vomiting."  Pt states she felt like "something was wrong" so she lowered herself to the ground before she lost consciousness. Denies hitting her head but does have a frontal headache, 5/10.  Denies neck, back, arm, leg, or abdominal pain. Denies chest pain or palpitations. Pt questions if she had a seizure but denies personal or family hx of seizures.  She notes she did test Positive for COVID about 3 weeks ago but has since tested negative. She notes she typically fasts in the morning and does not eat anything until a large meal in the afternoon and evening. She has done this "for at least 7 years."  She did start taking a new multivitamin for women in October 2021 but no other new medications or supplements. Denies personal hx of heart problems. She states her mother died of a blood clot due to "holes in her heart."  Pt states she, her father and brother were "worked up" after her mother passed but does not recall ever having an EKG or echocardiogram.    Past Medical History:  Diagnosis Date  . Allergy   . Anxiety   . Asthma    seasonl and exercise induced.    Patient Active Problem List   Diagnosis Date Noted  . HEADACHE, TENSION 09/08/2010  . ASTHMA 09/08/2010    Past Surgical History:  Procedure Laterality Date  . KNEE RECONSTRUCTION    . TONSILLECTOMY      OB History    Gravida  0   Para  0   Term  0   Preterm  0   AB  0   Living  0     SAB  0   IAB  0   Ectopic  0   Multiple  0   Live Births               Home Medications     Prior to Admission medications   Medication Sig Start Date End Date Taking? Authorizing Provider  albuterol (VENTOLIN HFA) 108 (90 Base) MCG/ACT inhaler Inhale 1-2 puffs into the lungs every 6 (six) hours as needed for wheezing or shortness of breath. 12/09/18   Lurene Shadow, PA-C  AZO-CRANBERRY PO Take by mouth.    [provider]  cetirizine (ZYRTEC) 10 MG tablet Take 10 mg by mouth daily.    [provider]  ibuprofen (ADVIL,MOTRIN) 200 MG tablet Take 400 mg by mouth.    [provider]  metroNIDAZOLE (FLAGYL) 500 MG tablet Take 1 tablet (500 mg total) by mouth 2 (two) times daily. 02/13/17   Allie Bossier, MD  Multiple Vitamins-Minerals (HAIR/SKIN/NAILS PO) Take by mouth.    [provider]    Family History Family History  Problem Relation Age of Onset  . Heart failure Mother   . Transient ischemic attack Mother   . Diabetes Father   . Hypertension Father     Social History Social History   Tobacco Use  . Smoking status:  Former Smoker    Types: Cigarettes  . Smokeless tobacco: Never Used  Substance Use Topics  . Alcohol use: No    Alcohol/week: 0.0 standard drinks  . Drug use: No     Allergies   Other and Shellfish allergy   Review of Systems Review of Systems  Constitutional: Negative for chills and fever.  HENT: Negative for congestion, ear pain, sore throat, trouble swallowing and voice change.   Respiratory: Negative for cough and shortness of breath.   Cardiovascular: Negative for chest pain and palpitations.  Gastrointestinal: Positive for nausea and vomiting. Negative for abdominal pain and diarrhea.  Musculoskeletal: Negative for arthralgias, back pain and myalgias.  Skin: Negative for rash.  Neurological: Positive for syncope, weakness, light-headedness and headaches. Negative for dizziness, tremors, seizures, speech difficulty and numbness.  All other systems reviewed and are negative.    Physical Exam Triage  Vital Signs ED Triage Vitals  Enc Vitals Group     BP 09/03/20 1654 114/80     Pulse --      Resp 09/03/20 1654 16     Temp 09/03/20 1654 98.6 F (37 C)     Temp Source 09/03/20 1654 Oral     SpO2 09/03/20 1654 96 %     Weight --      Height --      Head Circumference --      Peak Flow --      Pain Score 09/03/20 1652 5     Pain Loc --      Pain Edu? --      Excl. in GC? --    Orthostatic VS for the past 24 hrs:  BP- Lying Pulse- Lying BP- Sitting Pulse- Sitting BP- Standing at 0 minutes Pulse- Standing at 0 minutes  09/03/20 1717 104/70 83 108/71 96 105/71 101    Updated Vital Signs BP 114/80 (BP Location: Right Arm)   Temp 98.6 F (37 C) (Oral)   Resp 16   LMP 08/01/2020 (Exact Date)   SpO2 96%   Visual Acuity Right Eye Distance:   Left Eye Distance:   Bilateral Distance:    Right Eye Near:   Left Eye Near:    Bilateral Near:     Physical Exam Vitals and nursing note reviewed.  Constitutional:      General: She is not in acute distress.    Appearance: Normal appearance. She is well-developed and well-nourished. She is not ill-appearing, toxic-appearing or diaphoretic.  HENT:     Head: Normocephalic and atraumatic.     Right Ear: Tympanic membrane and ear canal normal.     Left Ear: Tympanic membrane and ear canal normal.     Nose: Nose normal.     Mouth/Throat:     Mouth: Mucous membranes are moist.     Pharynx: Oropharynx is clear.  Eyes:     Extraocular Movements: Extraocular movements intact and EOM normal.     Conjunctiva/sclera: Conjunctivae normal.     Pupils: Pupils are equal, round, and reactive to light.  Cardiovascular:     Rate and Rhythm: Normal rate and regular rhythm.  Pulmonary:     Effort: Pulmonary effort is normal. No respiratory distress.     Breath sounds: Normal breath sounds. No stridor. No wheezing, rhonchi or rales.  Abdominal:     General: There is no distension.     Palpations: Abdomen is soft.     Tenderness: There is no  abdominal tenderness.  Musculoskeletal:  General: Normal range of motion.     Cervical back: Normal range of motion and neck supple.  Skin:    General: Skin is warm and dry.     Capillary Refill: Capillary refill takes less than 2 seconds.  Neurological:     General: No focal deficit present.     Mental Status: She is alert and oriented to person, place, and time.  Psychiatric:        Mood and Affect: Mood and affect and mood normal.        Behavior: Behavior normal.      UC Treatments / Results  Labs (all labs ordered are listed, but only abnormal results are displayed) Labs Reviewed  POCT FASTING CBG [facility] MANUAL ENTRY    EKG Date/Time:09/03/2020   17:11:36 Ventricular Rate: 86 PR Interval: 148 QRS Duration: 74 QT Interval: 334 QTC Calculation: 399 P-R-T axes: 75   88   54 Text Interpretation: Normal sinus rhythm. Normal ECG  No prior to compare.   Radiology No results found.  Procedures Procedures (including critical care time)  Medications Ordered in UC Medications - No data to display  Initial Impression / Assessment and Plan / UC Course  I have reviewed the triage vital signs and the nursing notes.  Pertinent labs & imaging results that were available during my care of the patient were reviewed by me and considered in my medical decision making (see chart for details).     Normal EKG, blood glucose and orthostatic vitals. Normal neuro exam New unprovoked syncope with LOC for 10-20 seconds followed by vomiting is concerning, especially with associated frontal headache. Pt does report recent COVID infection  Recommend further evaluation and treatment in emergency department. Pt understanding and agreeable with further workup. Declined EMS transport. Will have her brother drive her to [facility] Emergency Department  Final Clinical Impressions(s) / UC Diagnoses   Final diagnoses:  Syncope and collapse  Vomiting in adult  History of  COVID-19     Discharge Instructions      You have declined EMS transport but it is still recommended you have your brother drive you to the emergency department for further evaluation and treatment as no definite cause was found in Urgent Care this evening.  The hospital has extra testing/imaging/resources not available here at this time.     ED Prescriptions    None     PDMP not reviewed this encounter.   Lurene Shadow, [place] 09/03/20 1739

## 2020-09-03 NOTE — ED Triage Notes (Signed)
Patient presents to Urgent Care with complaints of syncopal episode followed by projectile vomiting since 2 hours ago. Patient reports she felt like when she came to that her arms were shaking and drawn up, thinks she was passed out for 10-20 seconds. Pt states she was walking out of the kitchen when she passed out, had not had anything to eat or drink prior to LOC.

## 2020-09-04 ENCOUNTER — Telehealth: Payer: Self-pay | Admitting: Emergency Medicine

## 2020-09-04 NOTE — Telephone Encounter (Signed)
Called to see if Nancy Johnston had gone to ED as directed yesterday for further evaluation. Nancy Johnston states she is feeling better, increased her water intake, denies any more episodes of vomiting or syncope. Pt confirmed she would go to ED as directed in symptoms returned. Provider Waylan Rocher, [place]) updated

## 2020-09-04 NOTE — Telephone Encounter (Signed)
Called to check on pt. Left voicemail for pt to call back.

## 2020-12-27 IMAGING — DX CHEST - 2 VIEW
2 series · 2 of 2 positions shown · non-contrast
Comparison: 10/01/2014

CLINICAL DATA: Cough and wheezing

EXAM:
CHEST - 2 VIEW

[chest pa]
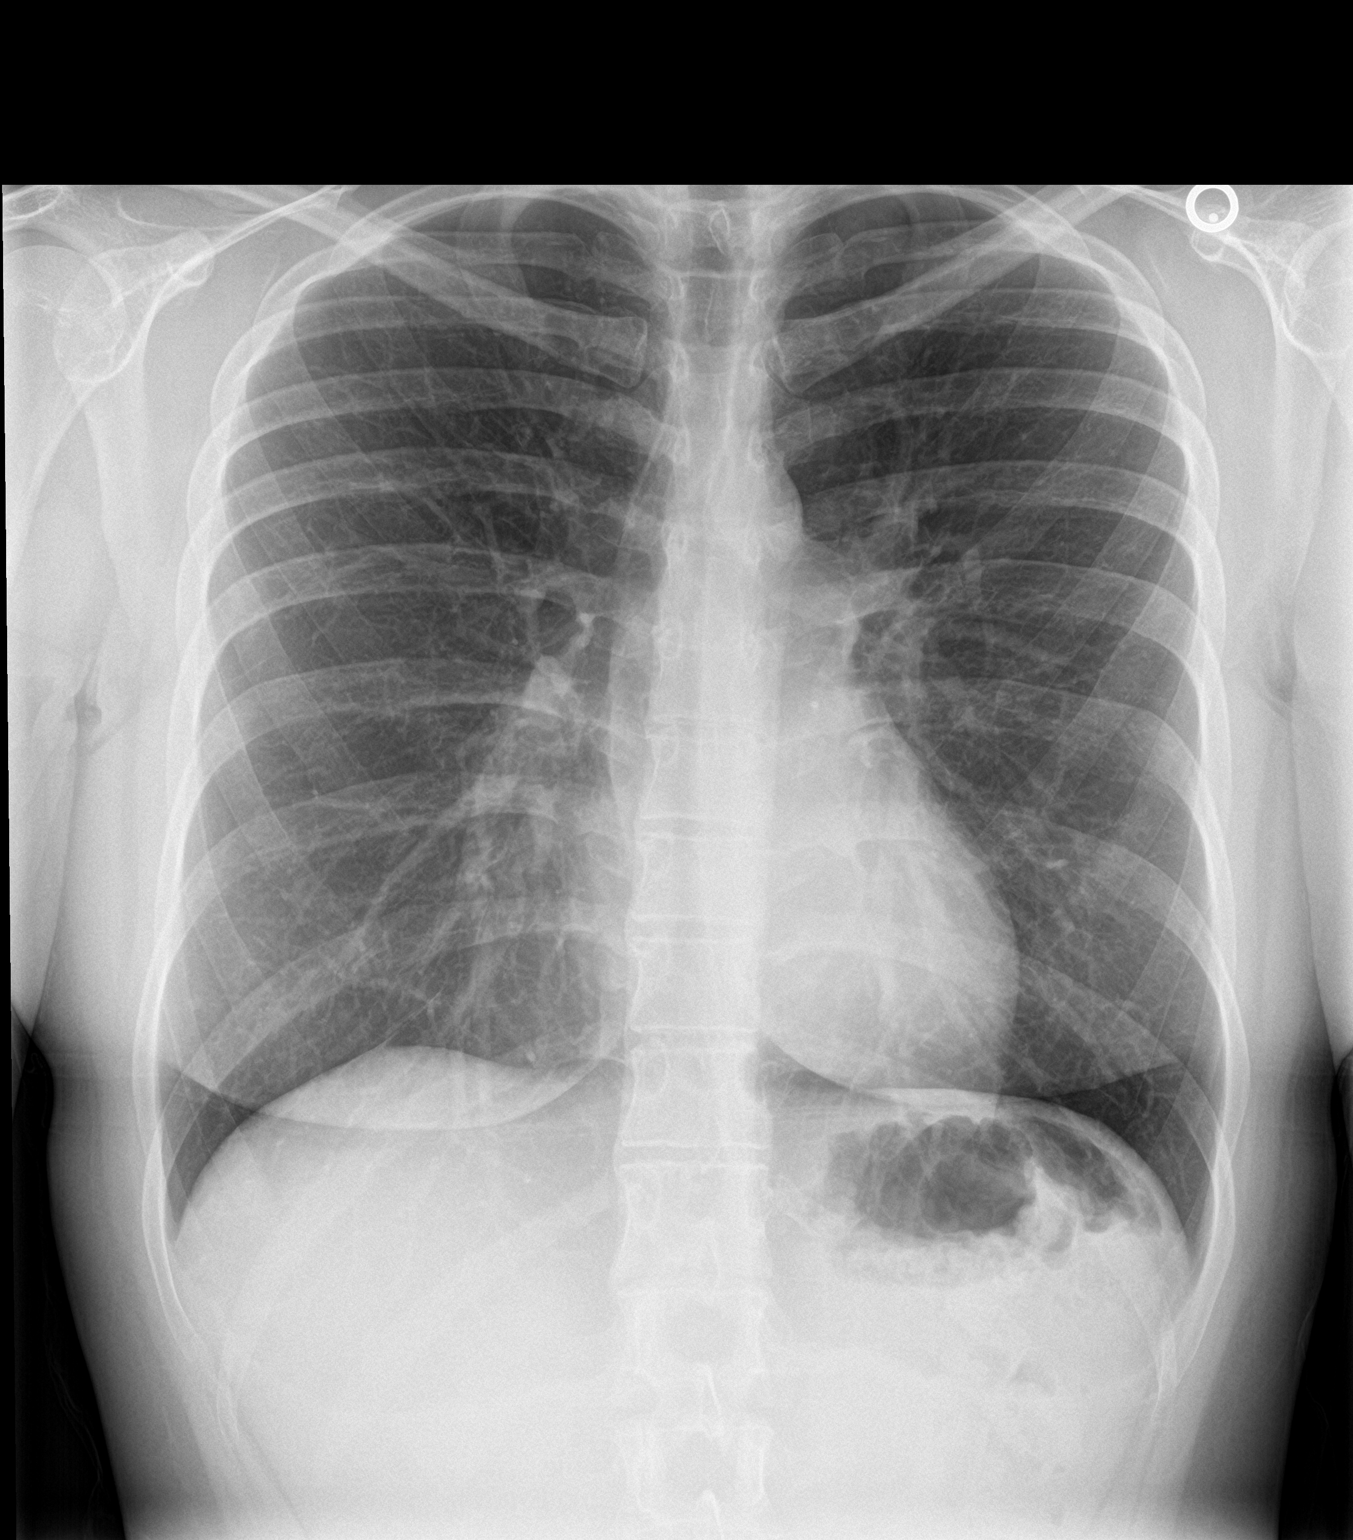

[chest lat]
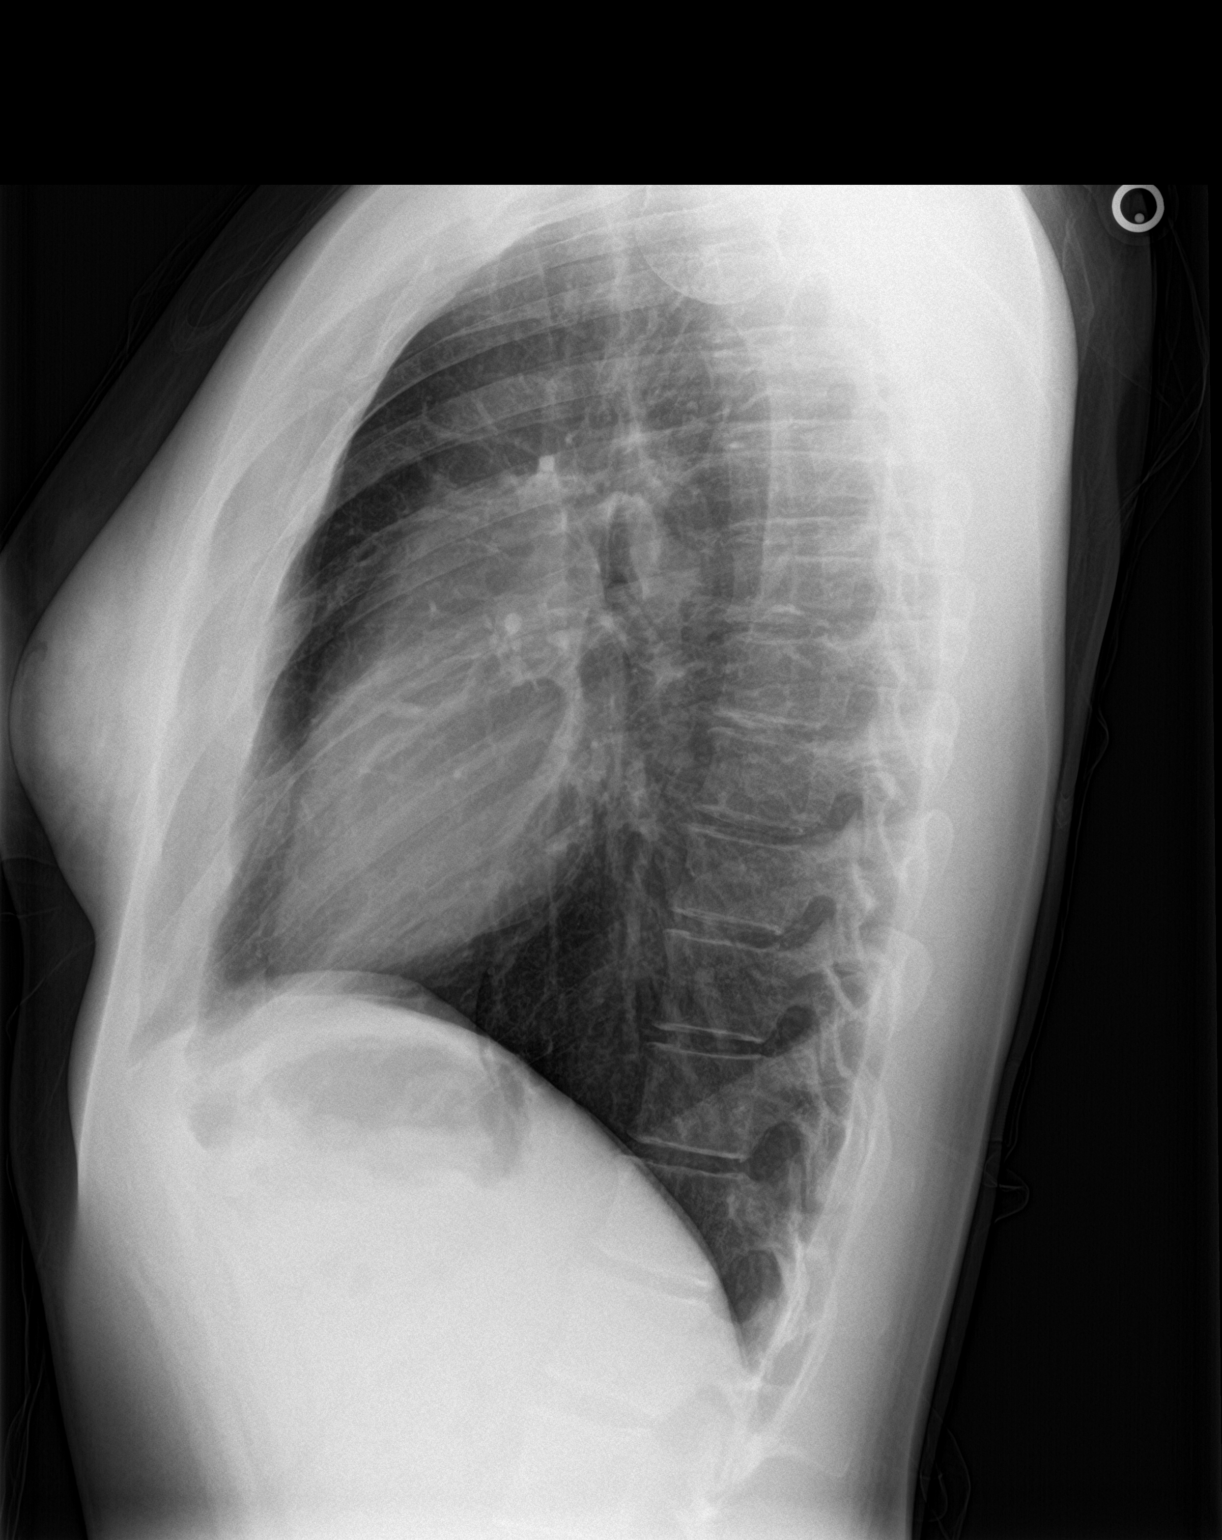

[2 of 2 positions shown; findings below may reference images not displayed]

FINDINGS: The heart size and mediastinal contours are within normal limits.
Both lungs are clear. The visualized skeletal structures are
unremarkable.
IMPRESSION: No active cardiopulmonary disease.

## 2021-04-26 ENCOUNTER — Other Ambulatory Visit: Payer: Self-pay

## 2021-04-26 ENCOUNTER — Emergency Department
Admission: EM | Admit: 2021-04-26 | Discharge: 2021-04-26 | Disposition: A | Discharge status: Home / Self Care | Payer: Managed Care, Other (non HMO) | Source: Home / Self Care | Attending: Family Medicine | Admitting: Family Medicine

## 2021-04-26 DIAGNOSIS — R509 Fever, unspecified: Secondary | ICD-10-CM

## 2021-04-26 NOTE — ED Triage Notes (Signed)
Pt presents with abdominal pain, back pain, dysuria, diarrhea, fever, headache, light sensitivity, and confusion that began yesterday. Pt states she is concerned for [facility] spotted fever. Pt states she recent was bitten by a tick.

## 2021-04-26 NOTE — ED Provider Notes (Signed)
Per RN triage "patient presents with abdominal pain, back pain, dysuria, diarrhea, fever, headache, light sensitivity, and confusion that began yesterday.  Patient states she is concerned for [facility] spotted fever.  Patient states she recently was bitten by a tick.  "Author: Zebedee Iba RN  As I enter the room the patient explained that she had had multiple tick bites this summer.  She states she and her boyfriend spend every weekend outdoors.  She states both of them have had multiple tick bites.  Her boyfriend was diagnosed with [facility] spotted fever based on febrile illness and rash.  He is on doxycycline.  She would like to be tested for [facility] spotted fever.  They recently got back from a trip to [place].  I explained to patient that it would be prudent to also test for COVID.  I explained that COVID can cause headache body aches and fever.  Patient states that she had COVID in March and did not believe she had COVID again.  I explained to her that after 3 months immunity wanes and it is actually positive to get COVID after that.  I asked the patient if she was COVID-vaccinated.  She states that her religion prevents her from having any vaccinations.  I told her I found that interesting, that research had been done that showed there were a few religions that were against vaccination, and I asked what religion she was.  She stated "I am not comfortable discussing that with you. "   She appeared to become upset at my inquiry and told me she was not comfortable, and wanted an alternate physician.  I explained that there is no alternate physician at this facility.  Patient chose to leave, and let me know on her way out that she would be calling back with a complaint.     Eustace Moore, MD 04/26/21 1359

## 2021-04-27 ENCOUNTER — Encounter: Payer: Self-pay | Admitting: Emergency Medicine

## 2021-04-27 ENCOUNTER — Emergency Department (INDEPENDENT_AMBULATORY_CARE_PROVIDER_SITE_OTHER)
Admission: EM | Admit: 2021-04-27 | Discharge: 2021-04-27 | Disposition: A | Discharge status: Home / Self Care | Payer: Managed Care, Other (non HMO) | Source: Home / Self Care

## 2021-04-27 ENCOUNTER — Other Ambulatory Visit: Payer: Self-pay

## 2021-04-27 DIAGNOSIS — R52 Pain, unspecified: Secondary | ICD-10-CM | POA: Diagnosis not present

## 2021-04-27 MED ORDER — DOXYCYCLINE HYCLATE 100 MG PO CAPS: 100.0000 mg | ORAL_CAPSULE | Freq: Two times a day (BID) | ORAL | 0 refills | Status: AC

## 2021-04-27 NOTE — ED Triage Notes (Signed)
Pt c/o body aches and feeling like she has a fever for the last 3 days. Has not checked her temp. She removed several ticks from her legs over the weekend and is concerned for RMSF. States her boyfriend is currently being treated for RMSF.

## 2021-04-27 NOTE — ED Provider Notes (Signed)
[facility]    CSN: [id number] Arrival date & time: 04/27/21  0803      History   Chief Complaint Chief Complaint  Patient presents with   Generalized Body Aches    Pt c/o body aches and feeling like she has a fever x3 days. She has not checked her temperature. States she was in the woods last weekend and noticed several ticks on her that she removed. States her boyfriend is being treated for RMSF currently.     HPI Nancy Johnston is a 25 y.o. female.   HPI 25 year old female presents with body aches feels like she has fever for the last 3 days although she has not checked her quantified her temperature.  Patient was evaluated here yesterday, 04/26/2021 for back pain, dysuria, diarrhea, fever, headache, light sensitivity, and confusion that began earlier that day.  Please see provider HPI from yesterday's encounter note.  Patient request doxycycline for tick bites of right arm.  Past Medical History:  Diagnosis Date   Allergy    Anxiety    Asthma    seasonl and exercise induced.    Patient Active Problem List   Diagnosis Date Noted   HEADACHE, TENSION 09/08/2010   ASTHMA 09/08/2010    Past Surgical History:  Procedure Laterality Date   KNEE RECONSTRUCTION     TONSILLECTOMY      OB History     Gravida  0   Para  0   Term  0   Preterm  0   AB  0   Living  0      SAB  0   IAB  0   Ectopic  0   Multiple  0   Live Births               Home Medications    Prior to Admission medications   Medication Sig Start Date End Date Taking? Authorizing Provider  doxycycline (VIBRAMYCIN) 100 MG capsule Take 1 capsule (100 mg total) by mouth 2 (two) times daily. 04/27/21  Yes Trevor Iha, FNP  albuterol (VENTOLIN HFA) 108 (90 Base) MCG/ACT inhaler Inhale 1-2 puffs into the lungs every 6 (six) hours as needed for wheezing or shortness of breath. Patient not taking: Reported on 04/26/2021 12/09/18   Lurene Shadow, PA-C  AZO-CRANBERRY PO Take  by mouth. Patient not taking: Reported on 04/26/2021    [provider]  cetirizine (ZYRTEC) 10 MG tablet Take 10 mg by mouth daily.    [provider]  ibuprofen (ADVIL,MOTRIN) 200 MG tablet Take 400 mg by mouth.    [provider]  metroNIDAZOLE (FLAGYL) 500 MG tablet Take 1 tablet (500 mg total) by mouth 2 (two) times daily. Patient not taking: Reported on 04/26/2021 02/13/17   Allie Bossier, MD  Multiple Vitamins-Minerals (HAIR/SKIN/NAILS PO) Take by mouth.    [provider]    Family History Family History  Problem Relation Age of Onset   Heart failure Mother    Transient ischemic attack Mother    Diabetes Father    Hypertension Father     Social History Social History   Tobacco Use   Smoking status: Former    Types: Cigarettes   Smokeless tobacco: Never  Substance Use Topics   Alcohol use: No    Alcohol/week: 0.0 standard drinks   Drug use: No     Allergies   Other and Shellfish allergy   Review of Systems Review of Systems  Constitutional:  Positive for fever.  Gastrointestinal:  Positive for diarrhea.  Genitourinary:  Positive for dysuria.  Musculoskeletal:  Positive for back pain and myalgias.  All other systems reviewed and are negative.   Physical Exam Triage Vital Signs ED Triage Vitals  Enc Vitals Group     BP 04/27/21 0827 124/67     Pulse Rate 04/27/21 0827 (!) 102     Resp --      Temp 04/27/21 0827 98.4 F (36.9 C)     Temp Source 04/27/21 0827 Oral     SpO2 04/27/21 0827 98 %     Weight 04/27/21 0829 145 lb (65.8 kg)     Height --      Head Circumference --      Peak Flow --      Pain Score 04/27/21 0828 5     Pain Loc --      Pain Edu? --      Excl. in GC? --    No data found.  Updated Vital Signs BP 124/67 (BP Location: Right Arm)   Pulse (!) 102   Temp 98.4 F (36.9 C) (Oral)   Wt 145 lb (65.8 kg)   SpO2 98%   BMI 22.05 kg/m    Physical Exam Vitals and nursing note reviewed.   Constitutional:      General: She is not in acute distress.    Appearance: Normal appearance. She is normal weight. She is not ill-appearing.  HENT:     Head: Normocephalic and atraumatic.     Mouth/Throat:     Mouth: Mucous membranes are moist.     Pharynx: Oropharynx is clear.  Eyes:     Extraocular Movements: Extraocular movements intact.     Conjunctiva/sclera: Conjunctivae normal.     Pupils: Pupils are equal, round, and reactive to light.  Cardiovascular:     Rate and Rhythm: Normal rate and regular rhythm.     Pulses: Normal pulses.     Heart sounds: Normal heart sounds.  Pulmonary:     Effort: Pulmonary effort is normal.     Breath sounds: Normal breath sounds. No wheezing, rhonchi or rales.  Musculoskeletal:        General: Normal range of motion.     Cervical back: Normal range of motion and neck supple.  Skin:    General: Skin is warm and dry.     Comments: Right upper/lower arm (dorsum over [facility]): three tiny < 3 mm mildly erythematous, nonindurated, nonfluctuant, no lymphatic streaking or to soft tissue swelling  Neurological:     General: No focal deficit present.     Mental Status: She is alert and oriented to person, place, and time. Mental status is at baseline.  Psychiatric:        Mood and Affect: Mood normal.        Behavior: Behavior normal.        Thought Content: Thought content normal.     UC Treatments / Results  Labs (all labs ordered are listed, but only abnormal results are displayed) Labs Reviewed - No data to display  EKG   Radiology No results found.  Procedures Procedures (including critical care time)  Medications Ordered in UC Medications - No data to display  Initial Impression / Assessment and Plan / UC Course  I have reviewed the triage vital signs and the nursing notes.  Pertinent labs & imaging results that were available during my care of the patient were reviewed by me and considered  in my medical decision making (see  chart for details).     MDM: 1. Body Aches-Rx'd Doxycycline-empirically for 3 tiny insect bites of right AC, Advised/encouraged patient to follow-up with PCP for infectious disease referral for appropriate lab draw at the appropriate time.  Advised/instructed patient to take medication as directed with food to completion.  Encouraged patient to increase daily water intake while taking this medication.  Discharged home, hemodynamically stable. Final Clinical Impressions(s) / UC Diagnoses   Final diagnoses:  Body aches     Discharge Instructions      Advised/encouraged patient to follow-up with PCP for infectious disease referral for appropriate lab draw at the appropriate time.  Advised/instructed patient to take medication as directed with food to completion.  Encouraged patient to increase daily water intake while taking this medication.     ED Prescriptions     Medication Sig Dispense Auth. Provider   doxycycline (VIBRAMYCIN) 100 MG capsule Take 1 capsule (100 mg total) by mouth 2 (two) times daily. 20 capsule Trevor Iha, FNP      PDMP not reviewed this encounter.   Trevor Iha, FNP 04/27/21 [phone]

## 2021-04-27 NOTE — Discharge Instructions (Addendum)
Advised/encouraged patient to follow-up with PCP for infectious disease referral for appropriate lab draw at the appropriate time.  Advised/instructed patient to take medication as directed with food to completion.  Encouraged patient to increase daily water intake while taking this medication.

## 2021-05-02 ENCOUNTER — Telehealth: Payer: Self-pay | Admitting: Medical

## 2021-05-02 NOTE — Telephone Encounter (Signed)
Spoke with patient, she has been experiencing fever at night, body aches, malaise, she has done at home Covid testing which is negative.  Her boyfriend has recently been diagnosed with RMSF and she is concerned this may be what is wrong with her also because she has had several recent mosquito bites.  Patient went to Urgent Care recently and was given 10 day supply of Doxycycline but reports that lab work to test for RMSF was denied.  Patient would like to know if she can be seen here and labs done or if we can enter blood work order for labs to be done at [facility].  Explained to patient that with her symptoms she would not be allowed in a regular visit but Covid clinic only and that she is a new patient.  I recommended to patient that she either go to a different urgent care or to the ER.  Patient agreed.
